# Patient Record
Sex: Male | Born: 1980 | Race: Black or African American | Hispanic: No | Marital: Married | Smoking: Never smoker
Health system: Southern US, Community
[De-identification: ages and names within clinical notes are randomized; demographics above are authoritative.]

---

## 2001-03-16 ENCOUNTER — Encounter: Payer: Self-pay | Admitting: Emergency Medicine

## 2001-03-16 ENCOUNTER — Emergency Department (HOSPITAL_COMMUNITY): Admission: EM | Admit: 2001-03-16 | Discharge: 2001-03-16 | Payer: Self-pay | Admitting: Emergency Medicine

## 2002-04-06 ENCOUNTER — Emergency Department (HOSPITAL_COMMUNITY): Admission: EM | Admit: 2002-04-06 | Discharge: 2002-04-07 | Payer: Self-pay | Admitting: Emergency Medicine

## 2008-08-06 ENCOUNTER — Encounter: Admission: RE | Admit: 2008-08-06 | Discharge: 2008-08-06 | Payer: Self-pay | Admitting: Pulmonary Disease

## 2010-01-01 ENCOUNTER — Encounter: Admission: RE | Admit: 2010-01-01 | Discharge: 2010-01-01 | Payer: Self-pay | Admitting: General Practice

## 2012-11-10 ENCOUNTER — Emergency Department (HOSPITAL_BASED_OUTPATIENT_CLINIC_OR_DEPARTMENT_OTHER)
Admission: EM | Admit: 2012-11-10 | Discharge: 2012-11-10 | Disposition: A | Discharge status: Home / Self Care | Payer: BC Managed Care – PPO | Attending: Emergency Medicine | Admitting: Emergency Medicine

## 2012-11-10 ENCOUNTER — Encounter (HOSPITAL_BASED_OUTPATIENT_CLINIC_OR_DEPARTMENT_OTHER): Payer: Self-pay | Admitting: *Deleted

## 2012-11-10 DIAGNOSIS — X503XXA Overexertion from repetitive movements, initial encounter: Secondary | ICD-10-CM | POA: Insufficient documentation

## 2012-11-10 DIAGNOSIS — Y9366 Activity, soccer: Secondary | ICD-10-CM | POA: Insufficient documentation

## 2012-11-10 DIAGNOSIS — S239XXA Sprain of unspecified parts of thorax, initial encounter: Secondary | ICD-10-CM | POA: Insufficient documentation

## 2012-11-10 DIAGNOSIS — S29012A Strain of muscle and tendon of back wall of thorax, initial encounter: Secondary | ICD-10-CM

## 2012-11-10 DIAGNOSIS — Y9239 Other specified sports and athletic area as the place of occurrence of the external cause: Secondary | ICD-10-CM | POA: Insufficient documentation

## 2012-11-10 MED ORDER — IBUPROFEN 800 MG PO TABS: 800.0000 mg | ORAL_TABLET | Freq: Three times a day (TID) | ORAL | Status: DC

## 2012-11-10 MED ORDER — METHOCARBAMOL 500 MG PO TABS: 500.0000 mg | ORAL_TABLET | Freq: Two times a day (BID) | ORAL | Status: DC

## 2012-11-10 NOTE — ED Notes (Signed)
Patient states he has had right lower back pain since this morning.  States yesterday he was playing soccer and may causes the pain.  Pain is radiating down into his buttocks and down right leg.

## 2012-11-10 NOTE — ED Provider Notes (Signed)
History     CSN: [id number]  Arrival date & time 11/10/12  1123   First MD Initiated Contact with Patient 11/10/12 1201      Chief Complaint  Patient presents with  . Back Pain    (Consider location/radiation/quality/duration/timing/severity/associated sxs/prior treatment) Patient is a 32 y.o. male presenting with back pain. The history is provided by the patient.  Back Pain Location:  Lumbar spine Quality:  Aching Radiates to:  Does not radiate Pain severity:  Moderate Onset quality:  Gradual Timing:  Constant Progression:  Worsening Chronicity:  New Context comment:  Exercise Relieved by:  Nothing Worsened by:  Nothing tried Ineffective treatments:  None tried Pt complains of pain in low back after playing soccer.  Pt has soreness down both legs  History reviewed. No pertinent past medical history.  History reviewed. No pertinent past surgical history.  No family history on file.  History  Substance Use Topics  . Smoking status: Never Smoker   . Smokeless tobacco: Never Used  . Alcohol Use: Yes     Comment: occassionally      Review of Systems  Musculoskeletal: Positive for back pain.  All other systems reviewed and are negative.    Allergies  Review of patient's allergies indicates no known allergies.  Home Medications  No current outpatient prescriptions on file.  BP 135/72  Pulse 67  Temp(Src) 98.3 F (36.8 C) (Oral)  Resp 18  Ht 5' 8.5" (1.74 m)  Wt 187 lb 3 oz (84.908 kg)  BMI 28.04 kg/m2  SpO2 99%  Physical Exam  Nursing note and vitals reviewed. Constitutional: He is oriented to person, place, and time. He appears well-developed and well-nourished.  HENT:  Head: Normocephalic and atraumatic.  Eyes: Pupils are equal, round, and reactive to light.  Cardiovascular: Normal rate.   Pulmonary/Chest: Effort normal.  Abdominal: Soft.  Musculoskeletal:  Tender ls spine,  From,  nv and ns intact,  Negative straight leg raise   Neurological: He is alert and oriented to person, place, and time. He has normal reflexes.  Skin: Skin is warm.  Psychiatric: He has a normal mood and affect.    ED Course  Procedures (including critical care time)  Labs Reviewed - No data to display No results found.   No diagnosis found.    MDM  Robaxin and ibuprofen        Lonia Skinner Romeo, PA-C 11/10/12 1225

## 2012-11-10 NOTE — ED Provider Notes (Signed)
Medical screening examination/treatment/procedure(s) were performed by non-physician practitioner and as supervising physician I was immediately available for consultation/collaboration.   Charles B. Sheldon, MD 11/10/12 1446 

## 2017-06-16 ENCOUNTER — Other Ambulatory Visit: Payer: Self-pay

## 2017-06-16 ENCOUNTER — Encounter (HOSPITAL_COMMUNITY): Payer: Self-pay | Admitting: *Deleted

## 2017-06-16 ENCOUNTER — Emergency Department (HOSPITAL_COMMUNITY): Payer: BLUE CROSS/BLUE SHIELD

## 2017-06-16 ENCOUNTER — Emergency Department (HOSPITAL_COMMUNITY)
Admission: EM | Admit: 2017-06-16 | Discharge: 2017-06-16 | Disposition: A | Discharge status: Home / Self Care | Payer: BLUE CROSS/BLUE SHIELD | Attending: Emergency Medicine | Admitting: Emergency Medicine

## 2017-06-16 DIAGNOSIS — R079 Chest pain, unspecified: Secondary | ICD-10-CM | POA: Diagnosis present

## 2017-06-16 DIAGNOSIS — R0789 Other chest pain: Secondary | ICD-10-CM | POA: Diagnosis not present

## 2017-06-16 DIAGNOSIS — Z79899 Other long term (current) drug therapy: Secondary | ICD-10-CM | POA: Diagnosis not present

## 2017-06-16 LAB — CBC
HCT: 40.9 % (ref 39.0–52.0)
Hemoglobin: 13.5 g/dL (ref 13.0–17.0)
MCH: 27.1 pg (ref 26.0–34.0)
MCHC: 33 g/dL (ref 30.0–36.0)
MCV: 82 fL (ref 78.0–100.0)
PLATELETS: 252 10*3/uL (ref 150–400)
RBC: 4.99 MIL/uL (ref 4.22–5.81)
RDW: 14 % (ref 11.5–15.5)
WBC: 6.9 10*3/uL (ref 4.0–10.5)

## 2017-06-16 LAB — BASIC METABOLIC PANEL
Anion gap: 9 (ref 5–15)
BUN: 8 mg/dL (ref 6–20)
CALCIUM: 9.5 mg/dL (ref 8.9–10.3)
CO2: 25 mmol/L (ref 22–32)
CREATININE: 1.06 mg/dL (ref 0.61–1.24)
Chloride: 104 mmol/L (ref 101–111)
GFR calc non Af Amer: >60 mL/min (ref >60)
Glucose, Bld: 94 mg/dL (ref 65–99)
Potassium: 4 mmol/L (ref 3.5–5.1)
Sodium: 138 mmol/L (ref 135–145)

## 2017-06-16 LAB — I-STAT TROPONIN, ED: TROPONIN I, POC: 0.01 ng/mL (ref 0.00–0.08)

## 2017-06-16 MED ORDER — CYCLOBENZAPRINE HCL 5 MG PO TABS: 5.0000 mg | ORAL_TABLET | Freq: Three times a day (TID) | ORAL | 0 refills | Status: AC | PRN

## 2017-06-16 MED ORDER — CYCLOBENZAPRINE HCL 10 MG PO TABS
10.0000 mg | ORAL_TABLET | Freq: Once | ORAL | Status: AC
  Administered 2017-06-16: 10 mg via ORAL
  Filled 2017-06-16: qty 1

## 2017-06-16 NOTE — ED Provider Notes (Signed)
[facility] Provider Note   CSN: [id number] Arrival date & time: 06/16/17  0844     History   Chief Complaint Chief Complaint  Patient presents with  . Chest Pain    HPI Edward Huerta is a 37 y.o. male with no PMH presenting with sharp back and chest pain that came on suddenly yesterday evening when he was resting on the couch. Patient reports that the pain was 10/10 severity and was worse when he raised up his arms, improved when he relaxed his arms to his side. Pain was not relieved with ibuprofen. He positioned himself to feel less pain throughout the night and came into the ED this AM. No nausea, vomiting, or diaphoresis. No dyspnea other than pain worse with deep breaths.  Pain has gradually improved throughout the morning. No trauma or unusual physical activity, occupation does not involve hard labor. He does not smoke or use illicit drugs, endorses occasional alcohol use. No recent URI symptoms, no cough. No N/V/D/C or urinary symptoms.  History reviewed. No pertinent past medical history.  There are no active problems to display for this patient.  History reviewed. No pertinent surgical history.   Home Medications    Prior to Admission medications   Medication Sig Start Date End Date Taking? Authorizing Provider  cyclobenzaprine (FLEXERIL) 5 MG tablet Take 1 tablet (5 mg total) by mouth 3 (three) times daily as needed for muscle spasms. 06/16/17   Howard PouchFeng, Kalif Kattner, MD  ibuprofen (ADVIL,MOTRIN) 800 MG tablet Take 1 tablet (800 mg total) by mouth 3 (three) times daily. 11/10/12   Elson AreasSofia, Leslie K, PA-C  methocarbamol (ROBAXIN) 500 MG tablet Take 1 tablet (500 mg total) by mouth 2 (two) times daily. 11/10/12   Elson AreasSofia, Leslie K, PA-C    Family History No family history on file.  Social History Social History   Tobacco Use  . Smoking status: Never Smoker  . Smokeless tobacco: Never Used  Substance Use Topics  . Alcohol use: Yes    Comment:  occassionally  . Drug use: No     Allergies   Patient has no known allergies.   Review of Systems Review of Systems See HPI for ROS   Physical Exam Updated Vital Signs BP (!) 154/90 (BP Location: Right Arm)   Pulse 60   Temp 98.9 F (37.2 C) (Oral)   Resp 16   Ht 5\' 8"  (1.727 m)   SpO2 99%   BMI 28.46 kg/m   Physical Exam  Constitutional: He is oriented to person, place, and time. He appears well-developed and well-nourished.  HENT:  Head: Normocephalic and atraumatic.  Neck: Neck supple.  Cardiovascular: Normal rate, regular rhythm and normal pulses.  No m/r/g  Pulmonary/Chest: Effort normal and breath sounds normal. No respiratory distress.  No W/R/R  Abdominal: Soft. He exhibits no distension. There is no splenomegaly or hepatomegaly.  Musculoskeletal:       Right lower leg: He exhibits no edema.       Left lower leg: He exhibits no edema.  +ttp over trapezius medial to right scapula, no spinal process ttp.  Neurological: He is oriented to person, place, and time.  Skin: Skin is warm and dry.  Psychiatric: He has a normal mood and affect. His behavior is normal.     ED Treatments / Results  Labs (all labs ordered are listed, but only abnormal results are displayed) Labs Reviewed  BASIC METABOLIC PANEL  CBC  I-STAT TROPONIN, ED  EKG  EKG Interpretation  Date/Time:  Thursday June 16 2017 08:49:10 EST Ventricular Rate:  64 PR Interval:  140 QRS Duration: 88 QT Interval:  354 QTC Calculation: 365 R Axis:   51 Text Interpretation:  Normal sinus rhythm Normal ECG Confirmed by Cathren Laine ([id number]) on 06/16/2017 10:31:46 AM       Radiology Dg Chest 2 View  Result Date: 06/16/2017 CLINICAL DATA:  Chest pain.  Shortness of breath. EXAM: CHEST  2 VIEW COMPARISON:  08/06/2008. FINDINGS: Mediastinum and hilar structures are normal. Mild bibasilar subsegmental atelectasis. No focal alveolar infiltrate. No pleural effusion or pneumothorax. Heart size  normal. IMPRESSION: Mild bibasilar subsegmental atelectasis.  No focal infiltrate. Electronically Signed   By: Maisie Fus  Register   On: 06/16/2017 10:10    Procedures Procedures (including critical care time)  Medications Ordered in ED Medications  cyclobenzaprine (FLEXERIL) tablet 10 mg (10 mg Oral Given 06/16/17 1105)     Initial Impression / Assessment and Plan / ED Course  I have reviewed the triage vital signs and the nursing notes.  Pertinent labs & imaging results that were available during my care of the patient were reviewed by me and considered in my medical decision making (see chart for details).    37 year old with no PMH presenting for chest and back pain over the last 12 hours that came on suddenly at rest, most consistent with MSK pain.  MI less likely with duration and pattern of pain, as well as normal EKG and troponin. Considered PE, though this seems less likely with no tachycardia or hypoxia, patient is not feeling dyspneic and pain is reproducible on exam.  Dissection also considered, however patient is appearing comfortable now, CXR WNL, vitals normal.  Gave flexeril for MSK pain with some improvement in symptoms.  Patient continues to appear well in the emergency department. He is considered stable for discharge. Return precautions provided.  Final Clinical Impressions(s) / ED Diagnoses   Final diagnoses:  Chest wall pain    ED Discharge Orders        Ordered    cyclobenzaprine (FLEXERIL) 5 MG tablet  3 times daily PRN     06/16/17 1114     Howard Pouch, MD 06/16/17 1118    Howard Pouch, MD 06/16/17 1124    Cathren Laine, MD 06/16/17 1342

## 2017-06-16 NOTE — ED Triage Notes (Signed)
C/o chest pain worse with movement denies injury

## 2017-06-16 NOTE — Discharge Instructions (Signed)
You were seen in the emergency department for chest and back pain. This is most likely due to muscular pain. Your heart tests were normal.  You were sent with a muscle relaxer which may help with your pain. This may make you sleepy so please do not drive after taking the medication.  Please follow up with your regular doctor. Reasons to return to care would be if you have chest pain that is not improving, or if you have difficulty breathing.

## 2017-06-16 NOTE — ED Notes (Signed)
Pt getting dressed and awaiting nurse,.

## 2017-06-18 ENCOUNTER — Other Ambulatory Visit: Payer: Self-pay

## 2017-06-18 ENCOUNTER — Emergency Department (HOSPITAL_COMMUNITY)
Admission: EM | Admit: 2017-06-18 | Discharge: 2017-06-18 | Disposition: A | Discharge status: Home / Self Care | Payer: BLUE CROSS/BLUE SHIELD | Attending: Emergency Medicine | Admitting: Emergency Medicine

## 2017-06-18 ENCOUNTER — Encounter (HOSPITAL_COMMUNITY): Payer: Self-pay | Admitting: Emergency Medicine

## 2017-06-18 ENCOUNTER — Emergency Department (HOSPITAL_COMMUNITY): Payer: BLUE CROSS/BLUE SHIELD

## 2017-06-18 DIAGNOSIS — R0602 Shortness of breath: Secondary | ICD-10-CM | POA: Diagnosis not present

## 2017-06-18 DIAGNOSIS — R0789 Other chest pain: Secondary | ICD-10-CM | POA: Insufficient documentation

## 2017-06-18 DIAGNOSIS — R072 Precordial pain: Secondary | ICD-10-CM | POA: Diagnosis present

## 2017-06-18 LAB — BASIC METABOLIC PANEL
Anion gap: 12 (ref 5–15)
BUN: 8 mg/dL (ref 6–20)
CHLORIDE: 104 mmol/L (ref 101–111)
CO2: 21 mmol/L — AB (ref 22–32)
CREATININE: 1.16 mg/dL (ref 0.61–1.24)
Calcium: 9.2 mg/dL (ref 8.9–10.3)
GFR calc non Af Amer: >60 mL/min (ref >60)
Glucose, Bld: 114 mg/dL — ABNORMAL HIGH (ref 65–99)
POTASSIUM: 3.6 mmol/L (ref 3.5–5.1)
SODIUM: 137 mmol/L (ref 135–145)

## 2017-06-18 LAB — CBC
HEMATOCRIT: 40 % (ref 39.0–52.0)
HEMOGLOBIN: 12.9 g/dL — AB (ref 13.0–17.0)
MCH: 26.5 pg (ref 26.0–34.0)
MCHC: 32.3 g/dL (ref 30.0–36.0)
MCV: 82.1 fL (ref 78.0–100.0)
Platelets: 221 10*3/uL (ref 150–400)
RBC: 4.87 MIL/uL (ref 4.22–5.81)
RDW: 14 % (ref 11.5–15.5)
WBC: 7.1 10*3/uL (ref 4.0–10.5)

## 2017-06-18 LAB — I-STAT TROPONIN, ED
Troponin i, poc: 0 ng/mL (ref 0.00–0.08)
Troponin i, poc: 0 ng/mL (ref 0.00–0.08)

## 2017-06-18 LAB — INFLUENZA PANEL BY PCR (TYPE A & B)
INFLAPCR: NEGATIVE
INFLBPCR: NEGATIVE

## 2017-06-18 MED ORDER — ACETAMINOPHEN 500 MG PO TABS
1000.0000 mg | ORAL_TABLET | Freq: Once | ORAL | Status: AC
Start: 2017-06-18 — End: 2017-06-18
  Administered 2017-06-18: 1000 mg via ORAL
  Filled 2017-06-18: qty 2

## 2017-06-18 MED ORDER — DEXAMETHASONE SODIUM PHOSPHATE 10 MG/ML IJ SOLN
10.0000 mg | Freq: Once | INTRAMUSCULAR | Status: AC
  Administered 2017-06-18: 10 mg via INTRAVENOUS
  Filled 2017-06-18: qty 1

## 2017-06-18 MED ORDER — IPRATROPIUM-ALBUTEROL 0.5-2.5 (3) MG/3ML IN SOLN
3.0000 mL | Freq: Once | RESPIRATORY_TRACT | Status: AC
  Administered 2017-06-18: 3 mL via RESPIRATORY_TRACT
  Filled 2017-06-18: qty 3

## 2017-06-18 MED ORDER — ALBUTEROL SULFATE HFA 108 (90 BASE) MCG/ACT IN AERS: 1.0000 | INHALATION_SPRAY | Freq: Four times a day (QID) | RESPIRATORY_TRACT | 0 refills | Status: DC | PRN

## 2017-06-18 MED ORDER — MELOXICAM 15 MG PO TABS: 15.0000 mg | ORAL_TABLET | Freq: Every day | ORAL | 0 refills | Status: DC

## 2017-06-18 MED ORDER — IPRATROPIUM-ALBUTEROL 0.5-2.5 (3) MG/3ML IN SOLN
RESPIRATORY_TRACT | Status: AC
  Filled 2017-06-18: qty 3

## 2017-06-18 MED ORDER — IOPAMIDOL (ISOVUE-370) INJECTION 76%
INTRAVENOUS | Status: AC
  Administered 2017-06-18: 80 mL
  Filled 2017-06-18: qty 100

## 2017-06-18 MED ORDER — ONDANSETRON HCL 4 MG/2ML IJ SOLN
4.0000 mg | Freq: Once | INTRAMUSCULAR | Status: AC
  Administered 2017-06-18: 4 mg via INTRAVENOUS
  Filled 2017-06-18: qty 2

## 2017-06-18 MED ORDER — MORPHINE SULFATE (PF) 4 MG/ML IV SOLN
4.0000 mg | Freq: Once | INTRAVENOUS | Status: AC
  Administered 2017-06-18: 4 mg via INTRAVENOUS
  Filled 2017-06-18: qty 1

## 2017-06-18 MED ORDER — KETOROLAC TROMETHAMINE 30 MG/ML IJ SOLN
15.0000 mg | Freq: Once | INTRAMUSCULAR | Status: AC
  Administered 2017-06-18: 15 mg via INTRAVENOUS
  Filled 2017-06-18: qty 1

## 2017-06-18 NOTE — ED Notes (Signed)
Pt's temp was 100.5 RN Jesse SansJanee was notified

## 2017-06-18 NOTE — Discharge Instructions (Signed)
Your workup and imaging has been reassuring.  No signs of a heart attack or blood clot.  Your symptoms seem consistent with inflammation of your lung lining.  Please take the mobic as prescribed for pain. Do not take any additional NSAIDs including Motrin, Aleve, Ibuprofen, Advil.  May also take Tylenol.  Have given you an albuterol inhaler to use for any shortness of breath or cough.  Please make sure that you follow-up with your primary care doctor.  If your symptoms persist and are not improving the next 2-3 days return to the ED or follow with primary care doctor to have further workup.

## 2017-06-18 NOTE — ED Triage Notes (Signed)
Pt states he was seen in ED Thursday for intermittent chest pain that is now constant.  Reports sharp pain in center of chest that is non-radiating.  Intermittent SOB.

## 2017-06-18 NOTE — ED Provider Notes (Signed)
[facility] EMERGENCY DEPARTMENT Provider Note   CSN: [id number] Arrival date & time: 06/18/17  0306     History   Chief Complaint Chief Complaint  Patient presents with  . Chest Pain    HPI Edward Huerta is a 37 y.o. male.  HPI 37 year old African-American male with no pertinent past medical history presents to the emergency department today for evaluation of ongoing substernal chest pain.  Patient was seen on 1/24 in the ED for same.  At that time he had negative chest x-ray.  Was diagnosed with musculoskeletal pain and given a muscle relaxer.  Patient states that this is not helping his pain.  Patient reports substernal chest pain that is sharp and nonradiating.  Reports pleuritic chest pain and associated shortness of breath.  Patient states that it is hard to take a deep breath due to the pain.  Patient states the pain is constant and nothing makes better.  He has not training for the pain prior to arrival.  Patient denies any associated diaphoresis, nausea or emesis.  Denies any associated cough, congestion, sore throat.  Patient was febrile in the ED.  Denies any recent travel, history of IV drug use, long immobilization or recent tunnelization/surgeries, unilateral leg swelling or calf tenderness, tobacco use, history of DVT/PE.  Has any cardiac history or significant family cardiac history.  Pt denies any chill, ha, vision changes, lightheadedness, dizziness, congestion, neck pain, cough, abd pain, n/v/d, urinary symptoms, change in bowel habits, melena, hematochezia, lower extremity paresthesias.  History reviewed. No pertinent past medical history.  There are no active problems to display for this patient.   History reviewed. No pertinent surgical history.     Home Medications    Prior to Admission medications   Medication Sig Start Date End Date Taking? Authorizing Provider  cyclobenzaprine (FLEXERIL) 5 MG tablet Take 1 tablet (5 mg total) by mouth 3  (three) times daily as needed for muscle spasms. 06/16/17  Yes Howard Pouch, MD  ibuprofen (ADVIL,MOTRIN) 800 MG tablet Take 1 tablet (800 mg total) by mouth 3 (three) times daily. Patient not taking: Reported on 06/18/2017 11/10/12   Elson Areas, PA-C  methocarbamol (ROBAXIN) 500 MG tablet Take 1 tablet (500 mg total) by mouth 2 (two) times daily. Patient not taking: Reported on 06/18/2017 11/10/12   Osie Cheeks    Family History No family history on file.  Social History Social History   Tobacco Use  . Smoking status: Never Smoker  . Smokeless tobacco: Never Used  Substance Use Topics  . Alcohol use: Yes    Comment: occassionally  . Drug use: No     Allergies   Patient has no known allergies.   Review of Systems Review of Systems  Constitutional: Positive for fever. Negative for chills and diaphoresis.  HENT: Negative for congestion and sore throat.   Eyes: Negative for visual disturbance.  Respiratory: Positive for shortness of breath. Negative for cough, chest tightness and wheezing.   Cardiovascular: Positive for chest pain. Negative for palpitations and leg swelling.  Gastrointestinal: Negative for abdominal pain, diarrhea, nausea and vomiting.  Genitourinary: Negative for dysuria, flank pain, frequency, hematuria, scrotal swelling, testicular pain and urgency.  Musculoskeletal: Negative for arthralgias and myalgias.  Skin: Negative for rash.  Neurological: Negative for dizziness, syncope, weakness, light-headedness, numbness and headaches.  Psychiatric/Behavioral: Negative for sleep disturbance. The patient is not nervous/anxious.      Physical Exam Updated Vital Signs BP (!) 144/80  Pulse 78   Temp (!) 100.6 F (38.1 C) (Oral)   Resp 18   SpO2 97%   Physical Exam  Constitutional: He is oriented to person, place, and time. He appears well-developed and well-nourished.  Non-toxic appearance. No distress.  HENT:  Head: Normocephalic and  atraumatic.  Nose: Nose normal.  Mouth/Throat: Oropharynx is clear and moist.  Eyes: Conjunctivae are normal. Pupils are equal, round, and reactive to light. Right eye exhibits no discharge. Left eye exhibits no discharge.  Neck: Normal range of motion. Neck supple. No JVD present. No tracheal deviation present.  No c spine midline tenderness. No paraspinal tenderness. No deformities or step offs noted. Full ROM. Supple. No nuchal rigidity.    Cardiovascular: Normal rate, regular rhythm, normal heart sounds and intact distal pulses.  Pulmonary/Chest: Effort normal. No respiratory distress. He has decreased breath sounds. He has no wheezes. He has no rhonchi. He has no rales. He exhibits no tenderness.  Patient speaking in short complete sentences due to the pain.  No hypoxia or tachypnea.  Abdominal: Soft. Bowel sounds are normal. He exhibits no distension. There is no tenderness. There is no rebound and no guarding.  Musculoskeletal: Normal range of motion.       Right lower leg: Normal.       Left lower leg: Normal.  No lower extremity edema or calf tenderness.  Lymphadenopathy:    He has no cervical adenopathy.  Neurological: He is alert and oriented to person, place, and time.  Skin: Skin is warm and dry. Capillary refill takes less than 2 seconds. He is not diaphoretic.  Psychiatric: His behavior is normal. Judgment and thought content normal.  Nursing note and vitals reviewed.    ED Treatments / Results  Labs (all labs ordered are listed, but only abnormal results are displayed) Labs Reviewed  BASIC METABOLIC PANEL - Abnormal; Notable for the following components:      Result Value   CO2 21 (*)    Glucose, Bld 114 (*)    All other components within normal limits  CBC - Abnormal; Notable for the following components:   Hemoglobin 12.9 (*)    All other components within normal limits  INFLUENZA PANEL BY PCR (TYPE A & B)  I-STAT TROPONIN, ED    EKG  EKG  Interpretation  Date/Time:  Saturday June 18 2017 03:13:05 EST Ventricular Rate:  86 PR Interval:  136 QRS Duration: 86 QT Interval:  314 QTC Calculation: 375 R Axis:   33 Text Interpretation:  Normal sinus rhythm Normal ECG Normal ECG Confirmed by Gerhard MunchLockwood, Robert [phone]([id number]) on 06/18/2017 7:58:12 AM       Radiology Dg Chest 2 View  Result Date: 06/18/2017 CLINICAL DATA:  Patient was seen in the ED Thursday for intermittent chest pain. Chest pain is now constant. Sharp central non radiating pain. Intermittent shortness of breath. EXAM: CHEST  2 VIEW COMPARISON:  06/16/2017 FINDINGS: Slightly shallow inspiration. The heart size and mediastinal contours are within normal limits. Both lungs are clear. The visualized skeletal structures are unremarkable. IMPRESSION: No active cardiopulmonary disease. Electronically Signed   By: Burman NievesWilliam  Stevens M.D.   On: 06/18/2017 04:20   Ct Angio Chest Pe W/cm &/or Wo Cm  Result Date: 06/18/2017 CLINICAL DATA:  Chest pain and shortness of breath since 06/16/2017. EXAM: CT ANGIOGRAPHY CHEST WITH CONTRAST TECHNIQUE: Multidetector CT imaging of the chest was performed using the standard protocol during bolus administration of intravenous contrast. Multiplanar CT image reconstructions and MIPs were  obtained to evaluate the vascular anatomy. CONTRAST:  80 ml ISOVUE-370 IOPAMIDOL (ISOVUE-370) INJECTION 76% COMPARISON:  PA and lateral chest earlier today. Single-view of the chest 08/06/2008. FINDINGS: Cardiovascular: No pulmonary embolus is identified. Heart size is upper normal. No pericardial effusion. No calcific atherosclerosis is identified. Mediastinum/Nodes: No enlarged mediastinal, hilar, or axillary lymph nodes. Thyroid gland, trachea, and esophagus demonstrate no significant findings. Lungs/Pleura: No pleural effusion. There is some dependent atelectasis in the lung bases bilaterally. The lungs are otherwise clear. Upper Abdomen: Negative. Musculoskeletal: No  focal bony abnormality. Review of the MIP images confirms the above findings. IMPRESSION: Negative for pulmonary embolus or acute disease. Electronically Signed   By: Drusilla Kanner M.D.   On: 06/18/2017 09:18    Procedures Procedures (including critical care time)  Medications Ordered in ED Medications  ipratropium-albuterol (DUONEB) 0.5-2.5 (3) MG/3ML nebulizer solution (  Not Given 06/18/17 0831)  acetaminophen (TYLENOL) tablet 1,000 mg (1,000 mg Oral Given 06/18/17 0758)  ipratropium-albuterol (DUONEB) 0.5-2.5 (3) MG/3ML nebulizer solution 3 mL (3 mLs Nebulization Given 06/18/17 0809)  ketorolac (TORADOL) 30 MG/ML injection 15 mg (15 mg Intravenous Given 06/18/17 0809)  morphine 4 MG/ML injection 4 mg (4 mg Intravenous Given 06/18/17 0809)  ondansetron (ZOFRAN) injection 4 mg (4 mg Intravenous Given 06/18/17 0810)  ipratropium-albuterol (DUONEB) 0.5-2.5 (3) MG/3ML nebulizer solution 3 mL (3 mLs Nebulization Given 06/18/17 0826)  iopamidol (ISOVUE-370) 76 % injection (80 mLs  Contrast Given 06/18/17 0855)  dexamethasone (DECADRON) injection 10 mg (10 mg Intravenous Given 06/18/17 0946)     Initial Impression / Assessment and Plan / ED Course  I have reviewed the triage vital signs and the nursing notes.  Pertinent labs & imaging results that were available during my care of the patient were reviewed by me and considered in my medical decision making (see chart for details).     Patient presents to the ED for evaluation of pleuritic chest pain that is substernal and nonradiating.  Reports associated shortness of breath but denies any nausea, vomiting, diaphoresis.  Patient recently seen in the ED for same and diagnosed with musculoskeletal pain that was not improving with muscle relaxers.  Patient also reports developing a fever.  Denies any other associated URI symptoms or infectious symptoms including urinary symptoms, diarrhea or abdominal pain. PERC negative.   Patient does appear  uncomfortable on exam due to substernal chest pain.  He also is tachypneic and taking shallow breaths due to the pain.  Patient is febrile in the ED.  He is not hypoxic, no tachypnea or tachycardia is noted.   Lungs are clear to auscultation bilaterally.  Abdominal exam is benign.  Heart regular rate and rhythm without any rubs murmurs or gallops noted.  Neurovascularly intact in all extremities.  No significant signs of dehydration.  No nuchal rigidity that be concerning for meningitis.  Patient's lab work is reassuring.  No leukocytosis.  Hemoglobin appears at baseline.  Electrolytes are unremarkable.  Creatinine was normal.  Flu test was negative.  Patient had 2- delta troponins.  Patient is PERC negative however given his pleuritic chest pain was second ED visit for same felt it was reasonable to obtain a CT scan of chest to rule out any PE or occult pneumonia.  CT scan returned was unremarkable for any acute findings specifically no signs of PE or occult pneumonia.  I do not appreciate any significant pericardial effusion.  NSAIDs, steroids, albuterol inhaler and antipyretics in the ED.  On repeat assessment he states  that he feels much improved and his chest pain is completely resolved.  Patient appears more comfortable on exam.  Patient's presentation likely consistent with pleurisy.  Patient EKG showed normal sinus rhythm.  Negative delta troponins.  Very atypical for ACS.  No signs of PE or pneumonia on ct scan.  Patient denies any IV drug use.  Doubt bacteremia.  No signs of meningitis.  This is likely a viral illness causing the patient's pleurisy.  Encourage patient to treat with NSAIDs at home we will give prescription for Mobic.  Given albuterol inhaler to use as needed.  I instructed patient that if symptoms persist he follow-up with his primary doctor for further workup including possible echo.  Given normal EKG and troponins low suspicion for pericarditis or myocarditis at this  time.  Pt is hemodynamically stable, in NAD, & able to ambulate in the ED. Evaluation does not show pathology that would require ongoing emergent intervention or inpatient treatment. I explained the diagnosis to the patient. Pain has been managed & has no complaints prior to dc. Pt is comfortable with above plan and is stable for discharge at this time. All questions were answered prior to disposition. Strict return precautions for f/u to the ED were discussed. Encouraged follow up with PCP.  Pt seen and eval by my attending who is agreeable with the above plan.   Final Clinical Impressions(s) / ED Diagnoses   Final diagnoses:  Atypical chest pain  SOB (shortness of breath)    ED Discharge Orders        Ordered    meloxicam (MOBIC) 15 MG tablet  Daily     06/18/17 1047    albuterol (PROVENTIL HFA;VENTOLIN HFA) 108 (90 Base) MCG/ACT inhaler  Every 6 hours PRN     06/18/17 1047       Rise Mu, PA-C 06/18/17 1055    Gerhard Munch, MD 06/19/17 1650

## 2017-06-21 ENCOUNTER — Encounter (HOSPITAL_COMMUNITY): Payer: Self-pay

## 2017-06-21 ENCOUNTER — Emergency Department (HOSPITAL_COMMUNITY)
Admission: EM | Admit: 2017-06-21 | Discharge: 2017-06-21 | Disposition: A | Discharge status: Home / Self Care | Payer: BLUE CROSS/BLUE SHIELD | Attending: Emergency Medicine | Admitting: Emergency Medicine

## 2017-06-21 ENCOUNTER — Emergency Department (HOSPITAL_COMMUNITY): Payer: BLUE CROSS/BLUE SHIELD

## 2017-06-21 ENCOUNTER — Other Ambulatory Visit: Payer: Self-pay

## 2017-06-21 DIAGNOSIS — R0789 Other chest pain: Secondary | ICD-10-CM | POA: Diagnosis not present

## 2017-06-21 DIAGNOSIS — R079 Chest pain, unspecified: Secondary | ICD-10-CM | POA: Diagnosis present

## 2017-06-21 LAB — BASIC METABOLIC PANEL
ANION GAP: 7 (ref 5–15)
BUN: 12 mg/dL (ref 6–20)
CO2: 28 mmol/L (ref 22–32)
Calcium: 9.3 mg/dL (ref 8.9–10.3)
Chloride: 102 mmol/L (ref 101–111)
Creatinine, Ser: 0.98 mg/dL (ref 0.61–1.24)
GFR calc non Af Amer: >60 mL/min (ref >60)
Glucose, Bld: 89 mg/dL (ref 65–99)
POTASSIUM: 3.8 mmol/L (ref 3.5–5.1)
SODIUM: 137 mmol/L (ref 135–145)

## 2017-06-21 LAB — I-STAT TROPONIN, ED: Troponin i, poc: 0 ng/mL (ref 0.00–0.08)

## 2017-06-21 LAB — CBC
HCT: 39 % (ref 39.0–52.0)
HEMOGLOBIN: 12.7 g/dL — AB (ref 13.0–17.0)
MCH: 26.3 pg (ref 26.0–34.0)
MCHC: 32.6 g/dL (ref 30.0–36.0)
MCV: 80.7 fL (ref 78.0–100.0)
Platelets: 259 10*3/uL (ref 150–400)
RBC: 4.83 MIL/uL (ref 4.22–5.81)
RDW: 13.8 % (ref 11.5–15.5)
WBC: 5.8 10*3/uL (ref 4.0–10.5)

## 2017-06-21 MED ORDER — OMEPRAZOLE 20 MG PO CPDR: 20.0000 mg | DELAYED_RELEASE_CAPSULE | Freq: Every day | ORAL | 0 refills | Status: AC

## 2017-06-21 MED ORDER — PANTOPRAZOLE SODIUM 20 MG PO TBEC
20.0000 mg | DELAYED_RELEASE_TABLET | Freq: Once | ORAL | Status: AC
  Administered 2017-06-21: 20 mg via ORAL
  Filled 2017-06-21: qty 1

## 2017-06-21 MED ORDER — OMEPRAZOLE 20 MG PO CPDR: 20.0000 mg | DELAYED_RELEASE_CAPSULE | Freq: Every day | ORAL | 0 refills | Status: DC

## 2017-06-21 MED ORDER — GI COCKTAIL ~~LOC~~
30.0000 mL | Freq: Once | ORAL | Status: AC
  Administered 2017-06-21: 30 mL via ORAL
  Filled 2017-06-21: qty 30

## 2017-06-21 NOTE — Discharge Instructions (Signed)
It is important that you make a follow-up appointment with a family doctor.  If your symptoms persist, you may need additional testing scheduled or referral to specialist.  At this time, follow instructions for gastroesophageal reflux disease.  Take Prilosec every morning for the next 2 weeks.

## 2017-06-21 NOTE — ED Provider Notes (Signed)
[facility] Provider Note   CSN: [id number] Arrival date & time: 06/21/17  7829     History   Chief Complaint Chief Complaint  Patient presents with  . Chest Pain    HPI Edward Huerta is a 37 y.o. male.  HPI Patient has a same persisting pain that he has had since onset 6 days ago.  This is a third ED visit for this pain.  He reports that it is worse at certain times as opposed to others.  Pain is central below the sternum.  Right now he does not have much pain.  He reports during the night though he awakened with severe pain.  It did improve somewhat with sitting up.  He reports he has finished anti-inflammatories were prescribed to him.  He has not developed any new or additional symptoms.  No fever, no cough, no chills, no lower extremity swelling. Patient has lived in the [place] for 17 years.  Reports last time he traveled was in 2013.  He works at the same job that he has for the past 17 years.  No changes.  He does not notice any changes with eating. History reviewed. No pertinent past medical history.  There are no active problems to display for this patient.   History reviewed. No pertinent surgical history.     Home Medications    Prior to Admission medications   Medication Sig Start Date End Date Taking? Authorizing Provider  acetaminophen (TYLENOL) 500 MG tablet Take 500 mg by mouth 3 (three) times daily as needed (PAIN).   Yes [provider]  cyclobenzaprine (FLEXERIL) 5 MG tablet Take 1 tablet (5 mg total) by mouth 3 (three) times daily as needed for muscle spasms. 06/16/17  Yes Howard Pouch, MD  meloxicam (MOBIC) 15 MG tablet Take 1 tablet (15 mg total) by mouth daily. 06/18/17  Yes Leaphart, Lynann Beaver, PA-C  albuterol (PROVENTIL HFA;VENTOLIN HFA) 108 (90 Base) MCG/ACT inhaler Inhale 1-2 puffs into the lungs every 6 (six) hours as needed for wheezing or shortness of breath. Patient not taking: Reported on 06/21/2017  06/18/17   Demetrios Loll T, PA-C  ibuprofen (ADVIL,MOTRIN) 800 MG tablet Take 1 tablet (800 mg total) by mouth 3 (three) times daily. Patient not taking: Reported on 06/18/2017 11/10/12   Elson Areas, PA-C  methocarbamol (ROBAXIN) 500 MG tablet Take 1 tablet (500 mg total) by mouth 2 (two) times daily. Patient not taking: Reported on 06/18/2017 11/10/12   Elson Areas, PA-C  omeprazole (PRILOSEC) 20 MG capsule Take 1 capsule (20 mg total) by mouth daily. 06/21/17   Arby Barrette, MD    Family History History reviewed. No pertinent family history.  Social History Social History   Tobacco Use  . Smoking status: Never Smoker  . Smokeless tobacco: Never Used  Substance Use Topics  . Alcohol use: Yes    Comment: occassionally  . Drug use: No     Allergies   Patient has no known allergies.   Review of Systems Review of Systems 10 Systems reviewed and are negative for acute change except as noted in the HPI.   Physical Exam Updated Vital Signs BP 140/90   Pulse 68   Temp 98.7 F (37.1 C) (Oral)   Resp 16   Ht 5\' 8"  (1.727 m)   Wt 79.8 kg (176 lb)   SpO2 100%   BMI 26.76 kg/m   Physical Exam  Constitutional: He is oriented to person, place, and  time. He appears well-developed and well-nourished.  HENT:  Head: Normocephalic and atraumatic.  Posterior oropharynx widely patent.  No erythema no exudate.  Dentition in good condition.  Eyes: Conjunctivae and EOM are normal.  Neck: Neck supple.  Cardiovascular: Normal rate, regular rhythm, normal heart sounds and intact distal pulses.  No murmur heard. Pulmonary/Chest: Effort normal and breath sounds normal. No respiratory distress.  He is not endorsing reproducible chest pain at this time.  Abdominal: Soft. He exhibits no distension. There is no tenderness. There is no guarding.  Musculoskeletal: Normal range of motion. He exhibits no edema or tenderness.  Extremities are normal.  Well-developed well conditioned.   No peripheral edema or calf tenderness.  Lymphadenopathy:    He has no cervical adenopathy.  Neurological: He is alert and oriented to person, place, and time. No cranial nerve deficit. He exhibits normal muscle tone. Coordination normal.  Skin: Skin is warm and dry.  Psychiatric: He has a normal mood and affect.  Nursing note and vitals reviewed.    ED Treatments / Results  Labs (all labs ordered are listed, but only abnormal results are displayed) Labs Reviewed  CBC - Abnormal; Notable for the following components:      Result Value   Hemoglobin 12.7 (*)    All other components within normal limits  BASIC METABOLIC PANEL  I-STAT TROPONIN, ED    EKG  EKG Interpretation  Date/Time:  Tuesday June 21 2017 09:06:20 EST Ventricular Rate:  71 PR Interval:    QRS Duration: 88 QT Interval:  346 QTC Calculation: 376 R Axis:   51 Text Interpretation:  Sinus rhythm normal, no change. Confirmed by Arby Barrette [phone]) on 06/21/2017 12:11:28 PM       Radiology Dg Chest 2 View  Result Date: 06/21/2017 CLINICAL DATA:  Chest pain fever EXAM: CHEST  2 VIEW COMPARISON:  Chest CT 06/18/2017 FINDINGS: Normal cardiac silhouette. Mild streaky densities in the RIGHT lung base corresponding mild airspace disease on comparison CT. Trace pleural effusions posteriorly. IMPRESSION: Concern for mild RIGHT lower lobe pneumonia versus atelectasis. Electronically Signed   By: Genevive Bi M.D.   On: 06/21/2017 09:44    Procedures Procedures (including critical care time)  Medications Ordered in ED Medications  pantoprazole (PROTONIX) EC tablet 20 mg (not administered)  gi cocktail (Maalox,Lidocaine,Donnatal) (30 mLs Oral Given 06/21/17 1240)     Initial Impression / Assessment and Plan / ED Course  I have reviewed the triage vital signs and the nursing notes.  Pertinent labs & imaging results that were available during my care of the patient were reviewed by me and considered in my  medical decision making (see chart for details).      Final Clinical Impressions(s) / ED Diagnoses   Final diagnoses:  Atypical chest pain   Patient had extensive diagnostic evaluation in the past several days.  CT chest did not identify PE or other structural anomalies.  Patient has not developed fever, cough or dyspnea.  Lungs are clear and cardiac auscultation is normal.  At this time I do not have reason to believe he has endocarditis or valvular disease.  Patient has lived in the [place] for over a decade, have low suspicion for sequelae of tropical illness with lack of other positive findings on physical examination such is suggestion of congestive heart failure,  Murmur, fever or any vital sign anomaly.  Clinically, the patient is well in appearance with repeat negative diagnostic workup.  He has been treated for  suspected musculoskeletal pain.  Other consideration is for reflux or esophageal spasm.  He describes pain as severe when it occurs.  Will trial Prilosec and have counseled the patient on necessity of scheduling outpatient follow-up for potential referral to specialist or other diagnostic evaluation if symptoms are not resolving. ED Discharge Orders        Ordered    omeprazole (PRILOSEC) 20 MG capsule  Daily     06/21/17 1240       Arby BarrettePfeiffer, Francina Beery, MD 06/21/17 1247

## 2017-06-21 NOTE — ED Triage Notes (Signed)
Patient states he was [facility] for [facility] and was prescribed medications. Patient states it did not help the pain. Patient states he has heaviness in the mid chest and states when he lies flat the pain is worse. Patient denies any SOB or diaphoresis.

## 2017-06-23 LAB — CULTURE, BLOOD (SINGLE)
CULTURE: NO GROWTH
SPECIAL REQUESTS: ADEQUATE

## 2018-08-02 IMAGING — CT CT ANGIO CHEST
2 of 6 series · 18 of 36 positions shown · IV contrast (Omni 300)
Comparison: PA and lateral chest earlier today. Single-view of the
chest 08/06/2008.

CLINICAL DATA: Chest pain and shortness of breath since 06/16/2017.

EXAM:
CT ANGIOGRAPHY CHEST WITH CONTRAST
TECHNIQUE: Multidetector CT imaging of the chest was performed using the
standard protocol during bolus administration of intravenous
contrast. Multiplanar CT image reconstructions and MIPs were
obtained to evaluate the vascular anatomy.
CONTRAST:  80 ml 4Q6LHY-8X6 IOPAMIDOL ([id number]) INJECTION 76%

[Series 7: pe thins · axial · 0.62mm/px · z∈[+1196,+1445]mm · 17 of 281 slices shown]
[im 16/281  lung]
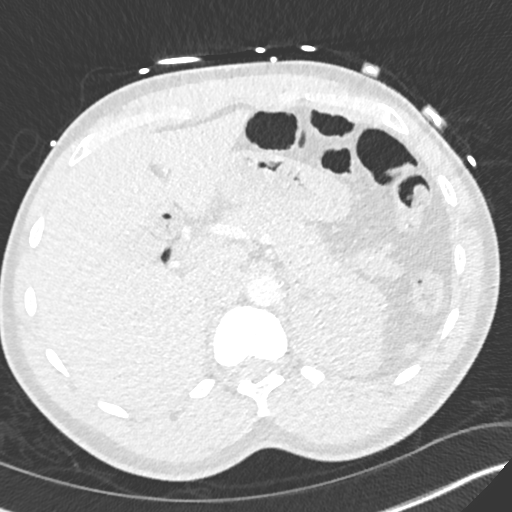
[im 32/281  mediastinal]
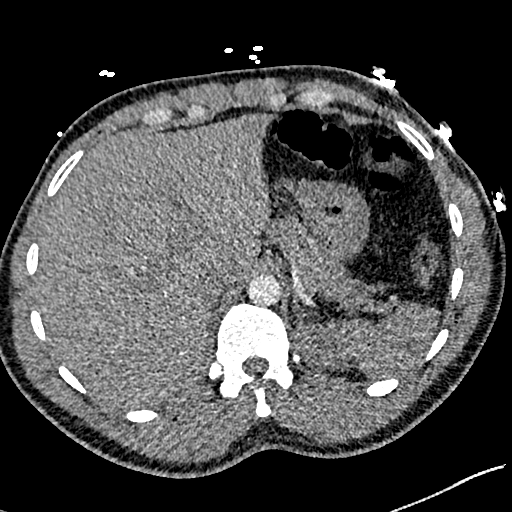
[im 47/281  lung]
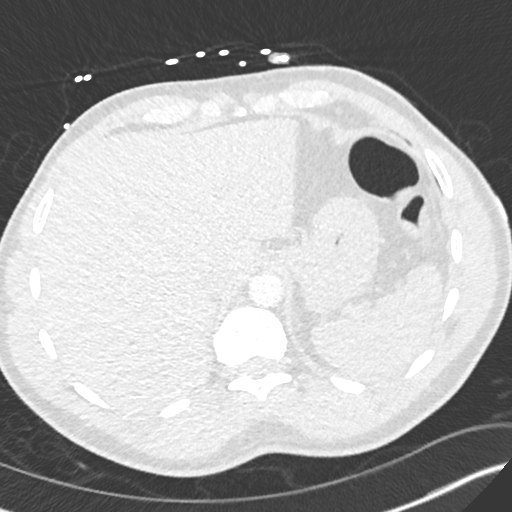
[im 63/281  mediastinal]
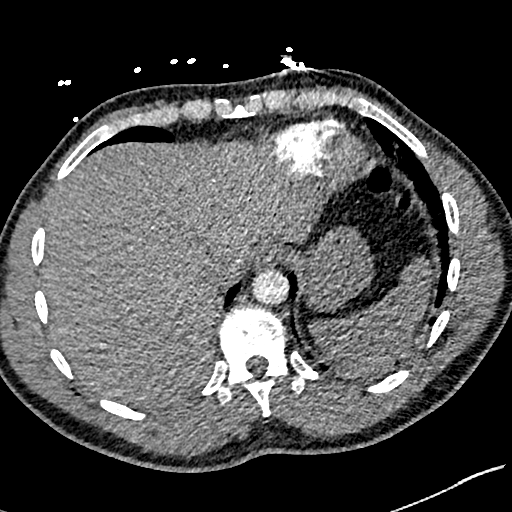
[im 78/281  lung]
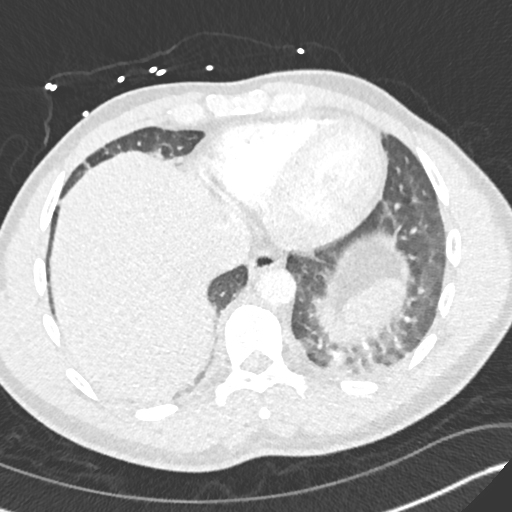
[im 94/281  mediastinal]
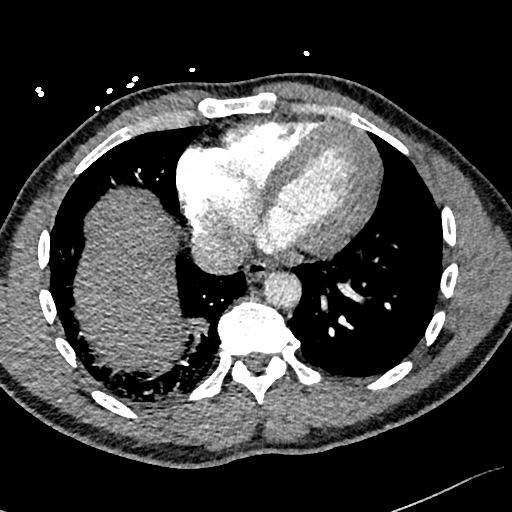
[im 109/281  lung]
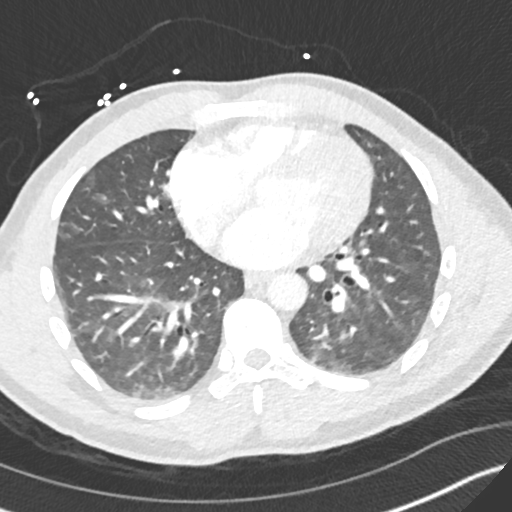
[im 125/281  mediastinal]
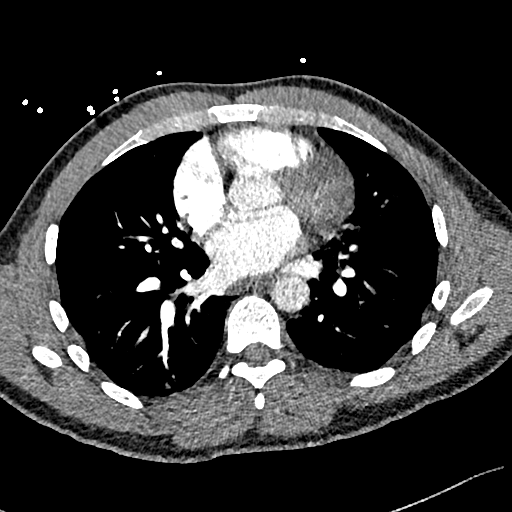
[im 141/281  lung]
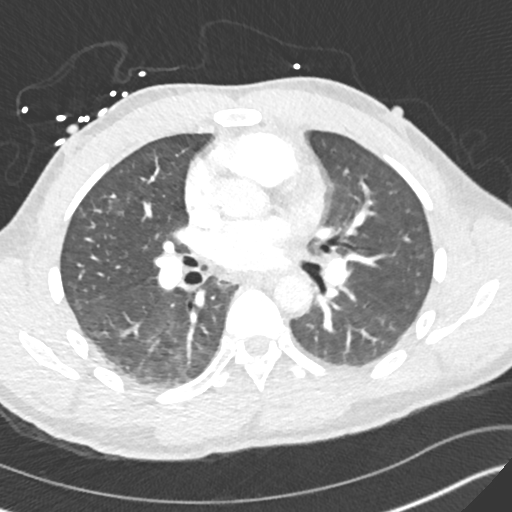
[im 156/281  mediastinal]
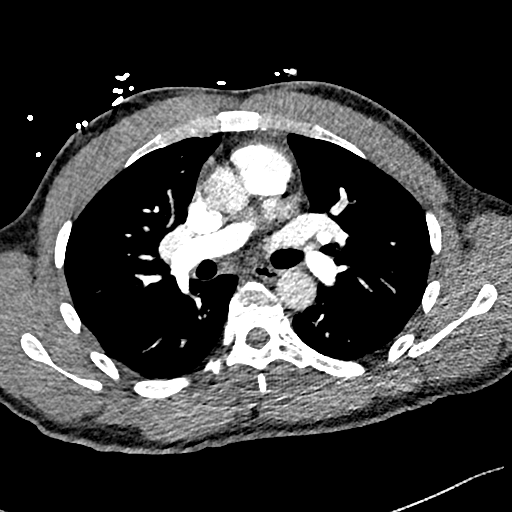
[im 172/281  lung]
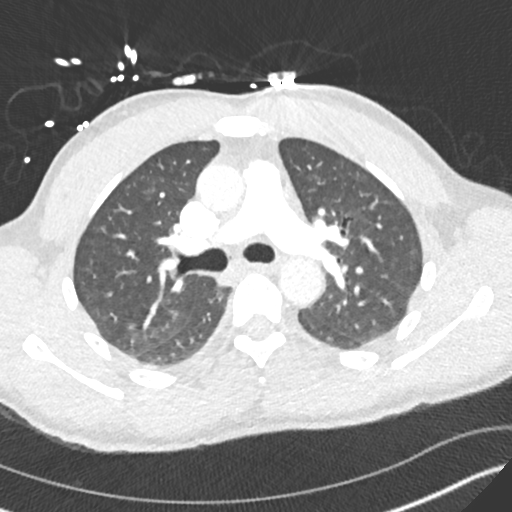
[im 187/281  mediastinal]
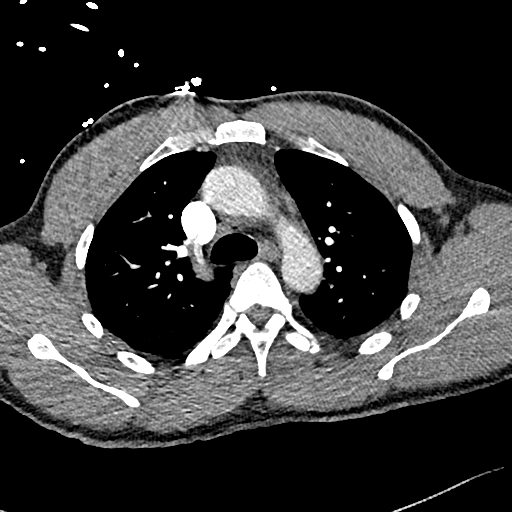
[im 203/281  lung]
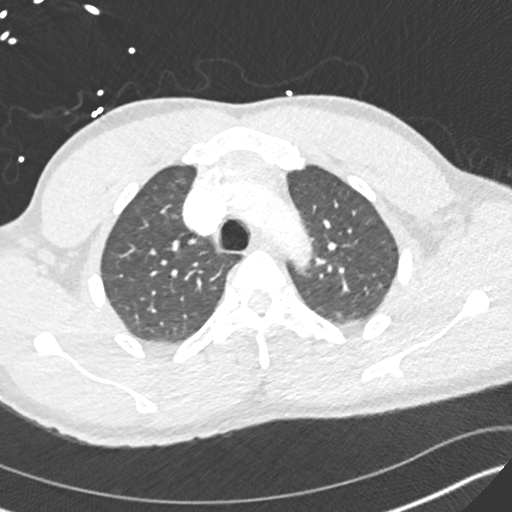
[im 218/281  mediastinal]
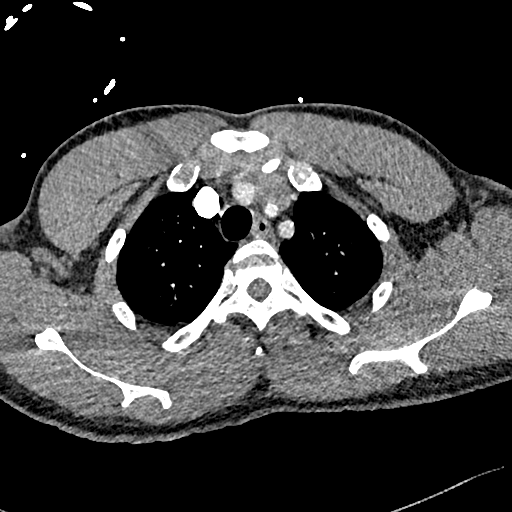
[im 234/281  lung]
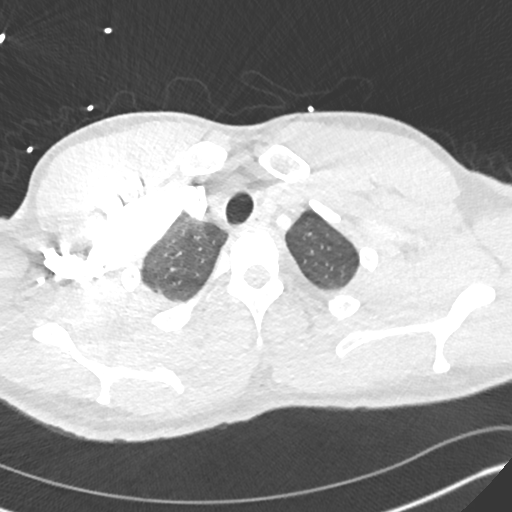
[im 249/281  mediastinal]
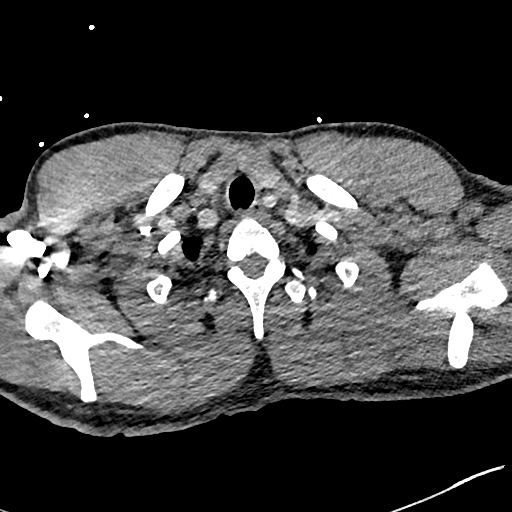
[im 265/281  lung]
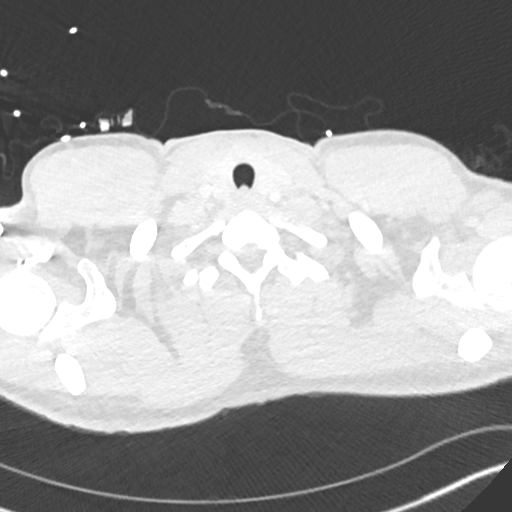

[Series 8: pe 2mm cor · coronal · 0.57mm/px · 1 of 102 slices shown]
[im 51/102  mediastinal]
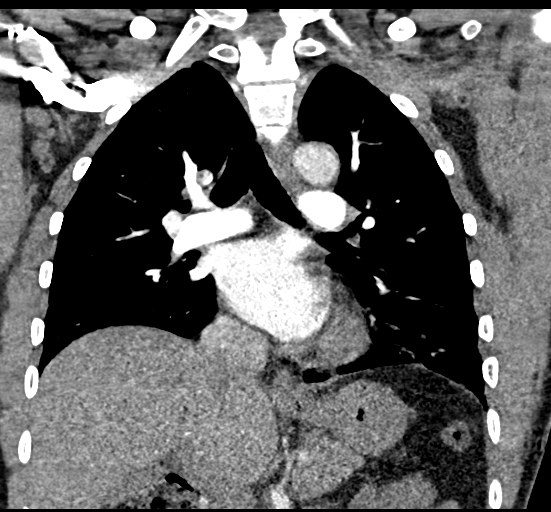

[18 of 36 positions shown; findings below may reference images not displayed]

FINDINGS: Cardiovascular: No pulmonary embolus is identified. Heart size is
upper normal. No pericardial effusion. No calcific atherosclerosis
is identified.

Mediastinum/Nodes: No enlarged mediastinal, hilar, or axillary lymph
nodes. Thyroid gland, trachea, and esophagus demonstrate no
significant findings.

Lungs/Pleura: No pleural effusion. There is some dependent
atelectasis in the lung bases bilaterally. The lungs are otherwise
clear.

Upper Abdomen: Negative.

Musculoskeletal: No focal bony abnormality.

Review of the MIP images confirms the above findings.
IMPRESSION: Negative for pulmonary embolus or acute disease.

## 2018-08-02 IMAGING — DX DG CHEST 2V
2 series · 2 of 2 positions shown · non-contrast
Comparison: 06/16/2017

CLINICAL DATA: Patient was seen in the ED [REDACTED] for intermittent
chest pain. Chest pain is now constant. Sharp central non radiating
pain. Intermittent shortness of breath.

EXAM:
CHEST  2 VIEW

[chest pa]
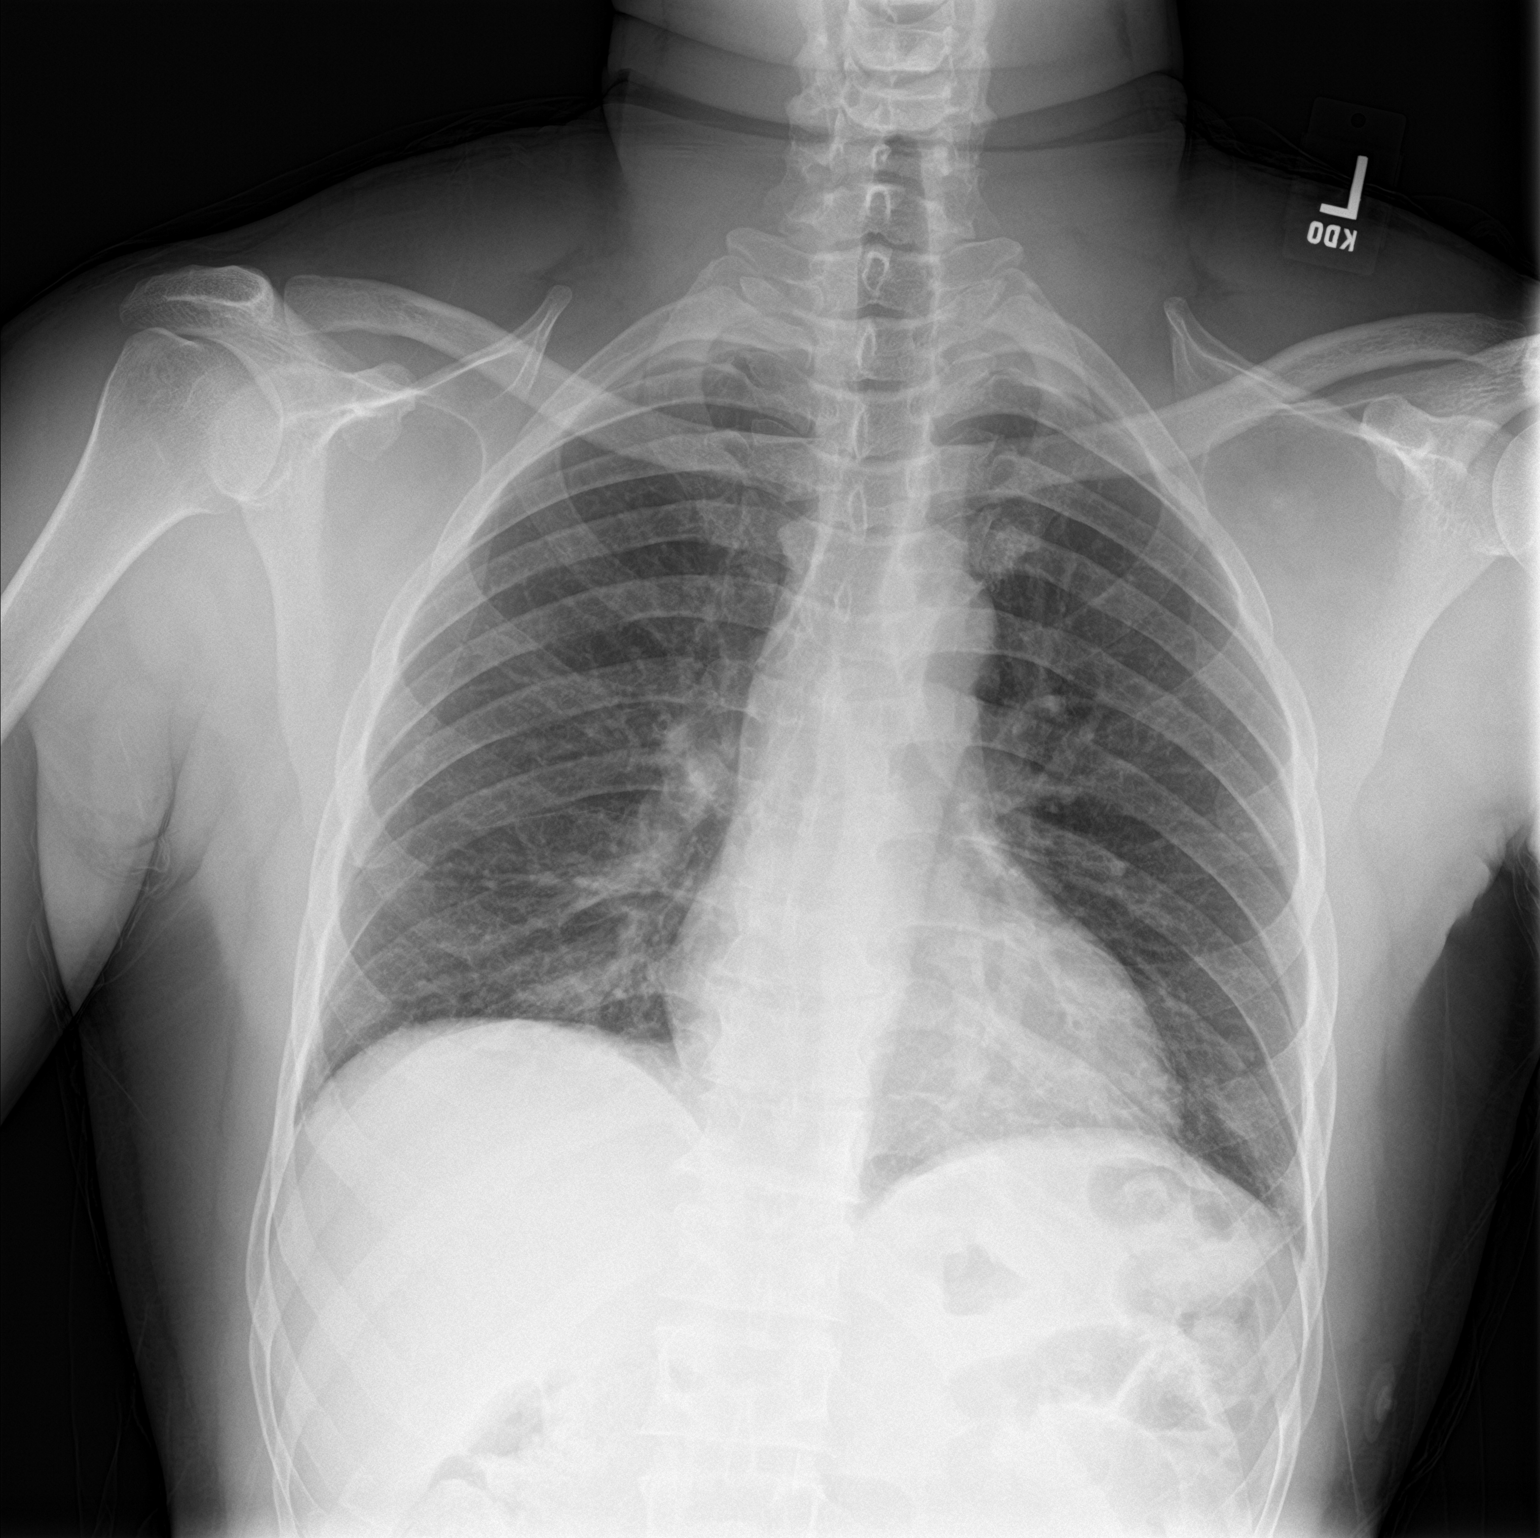

[chest lat]
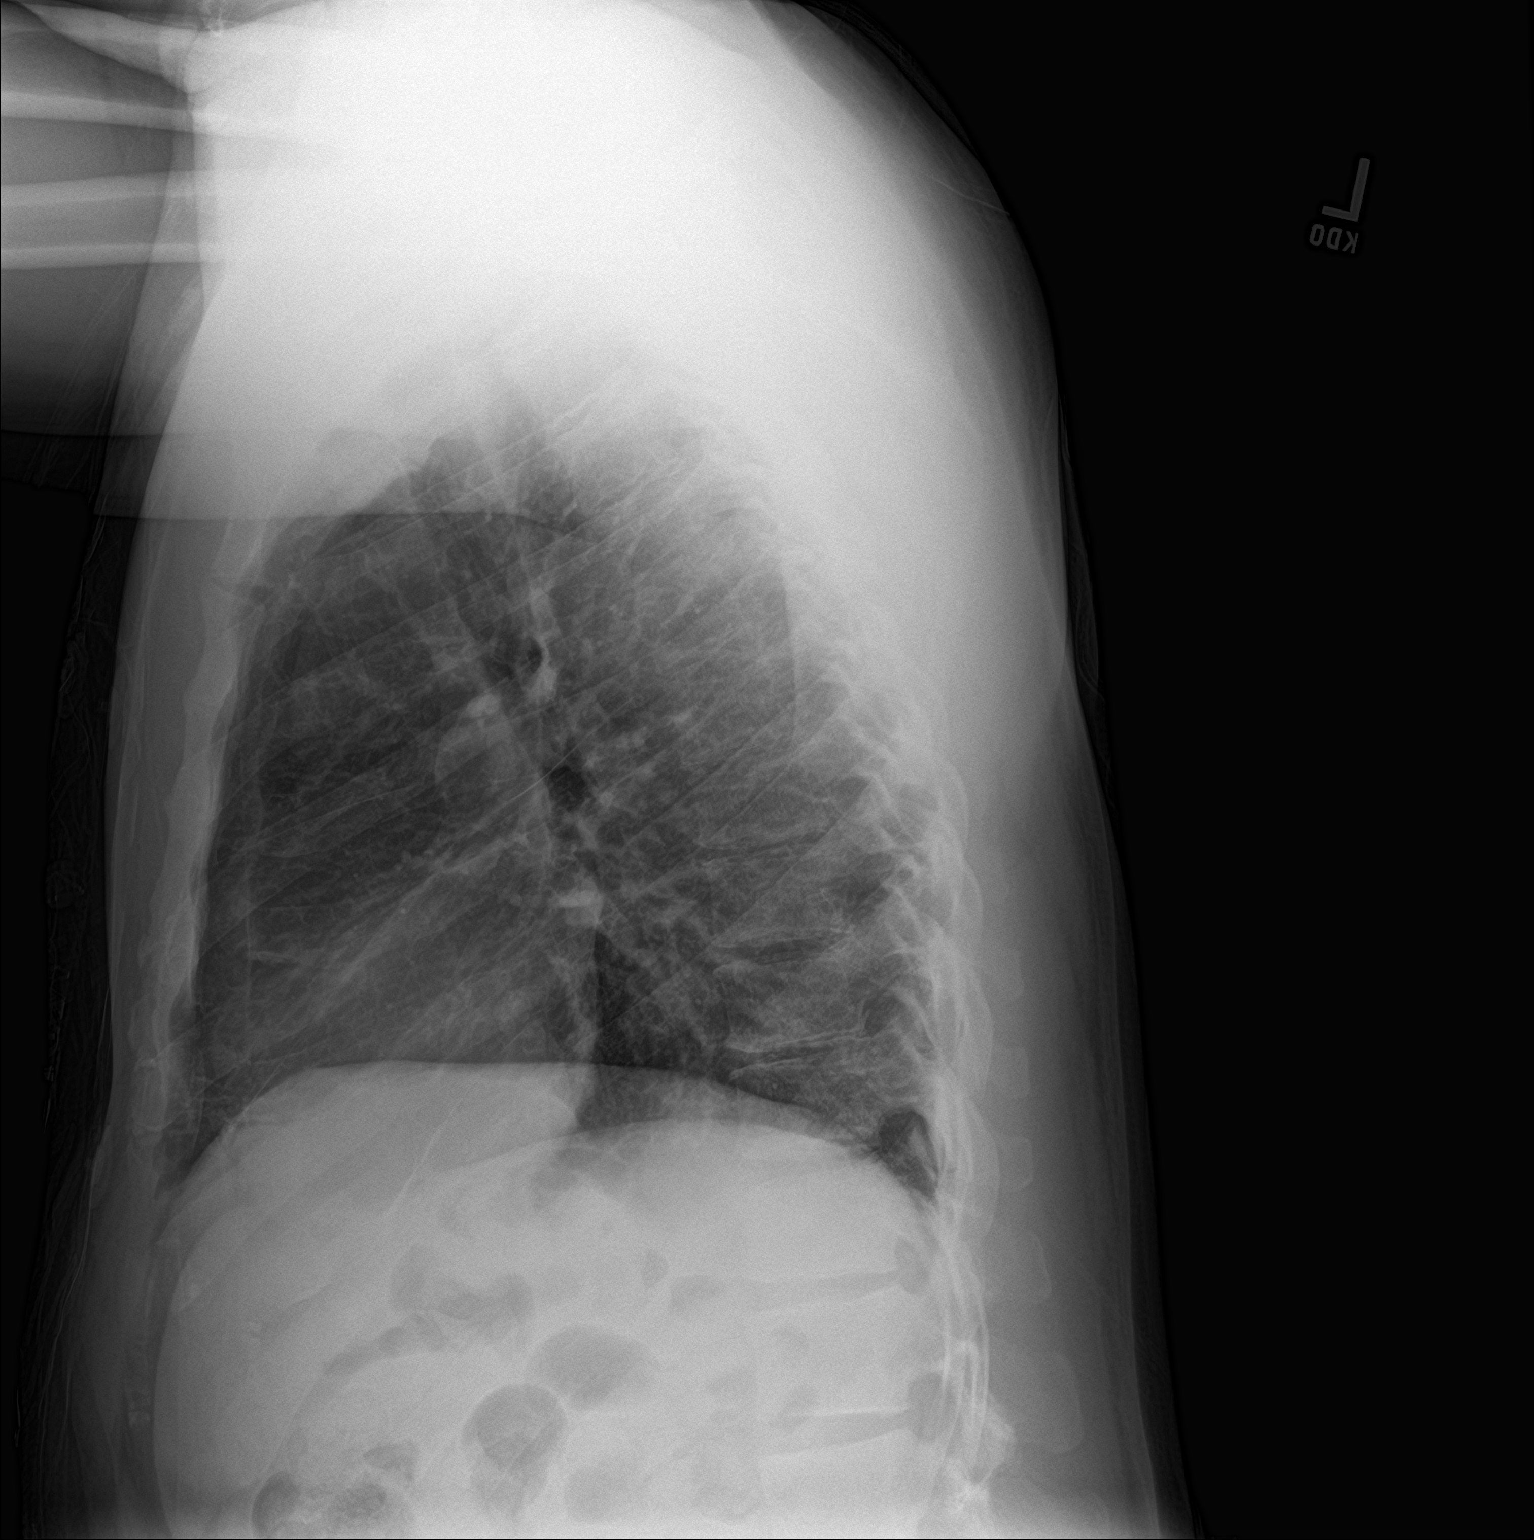

[2 of 2 positions shown; findings below may reference images not displayed]

FINDINGS: Slightly shallow inspiration. The heart size and mediastinal
contours are within normal limits. Both lungs are clear. The
visualized skeletal structures are unremarkable.
IMPRESSION: No active cardiopulmonary disease.

## 2018-08-05 IMAGING — CR DG CHEST 2V
2 series · 2 of 2 positions shown · non-contrast
Comparison: Chest CT 06/18/2017

CLINICAL DATA: Chest pain fever

EXAM:
CHEST  2 VIEW

[w chest pa]
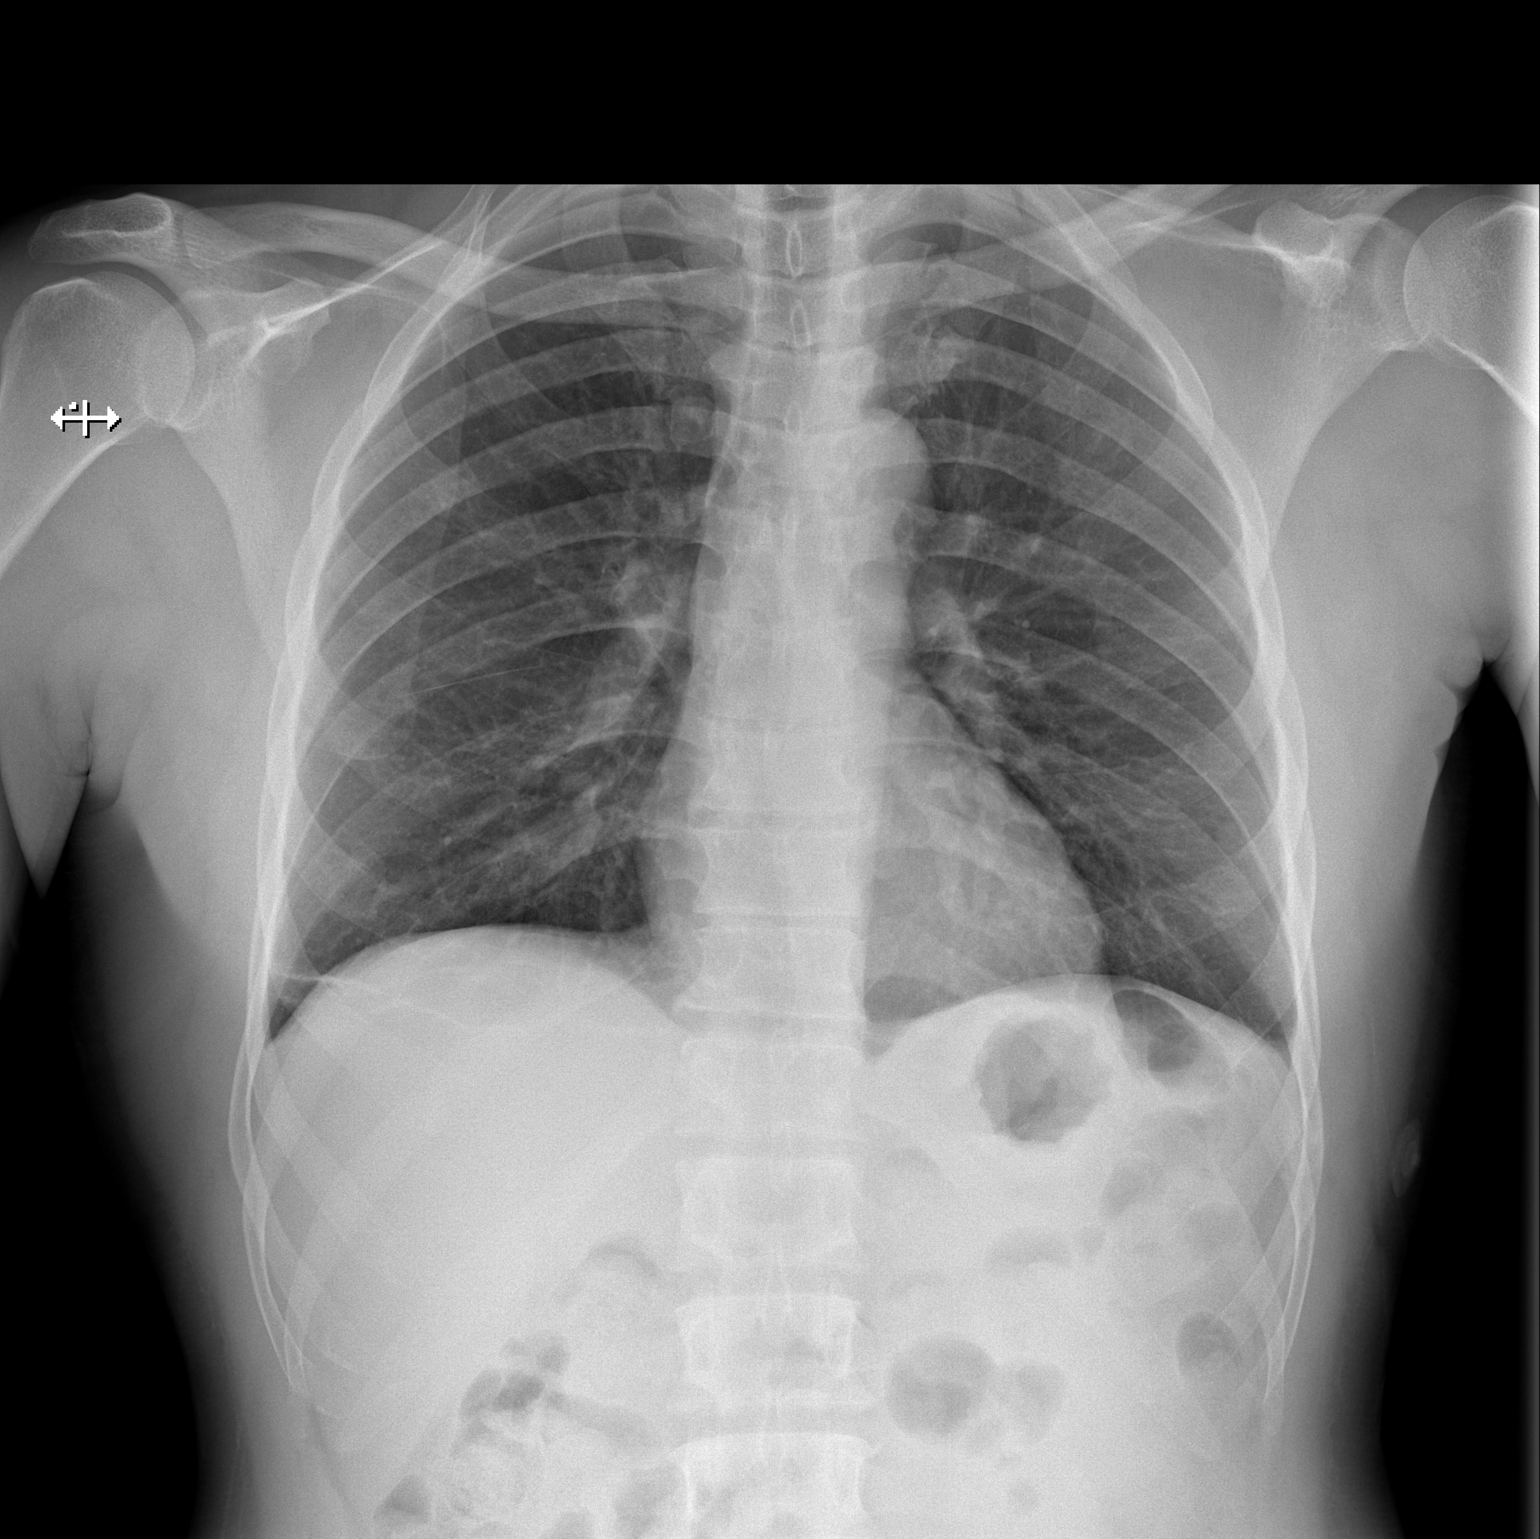

[w chest lat]
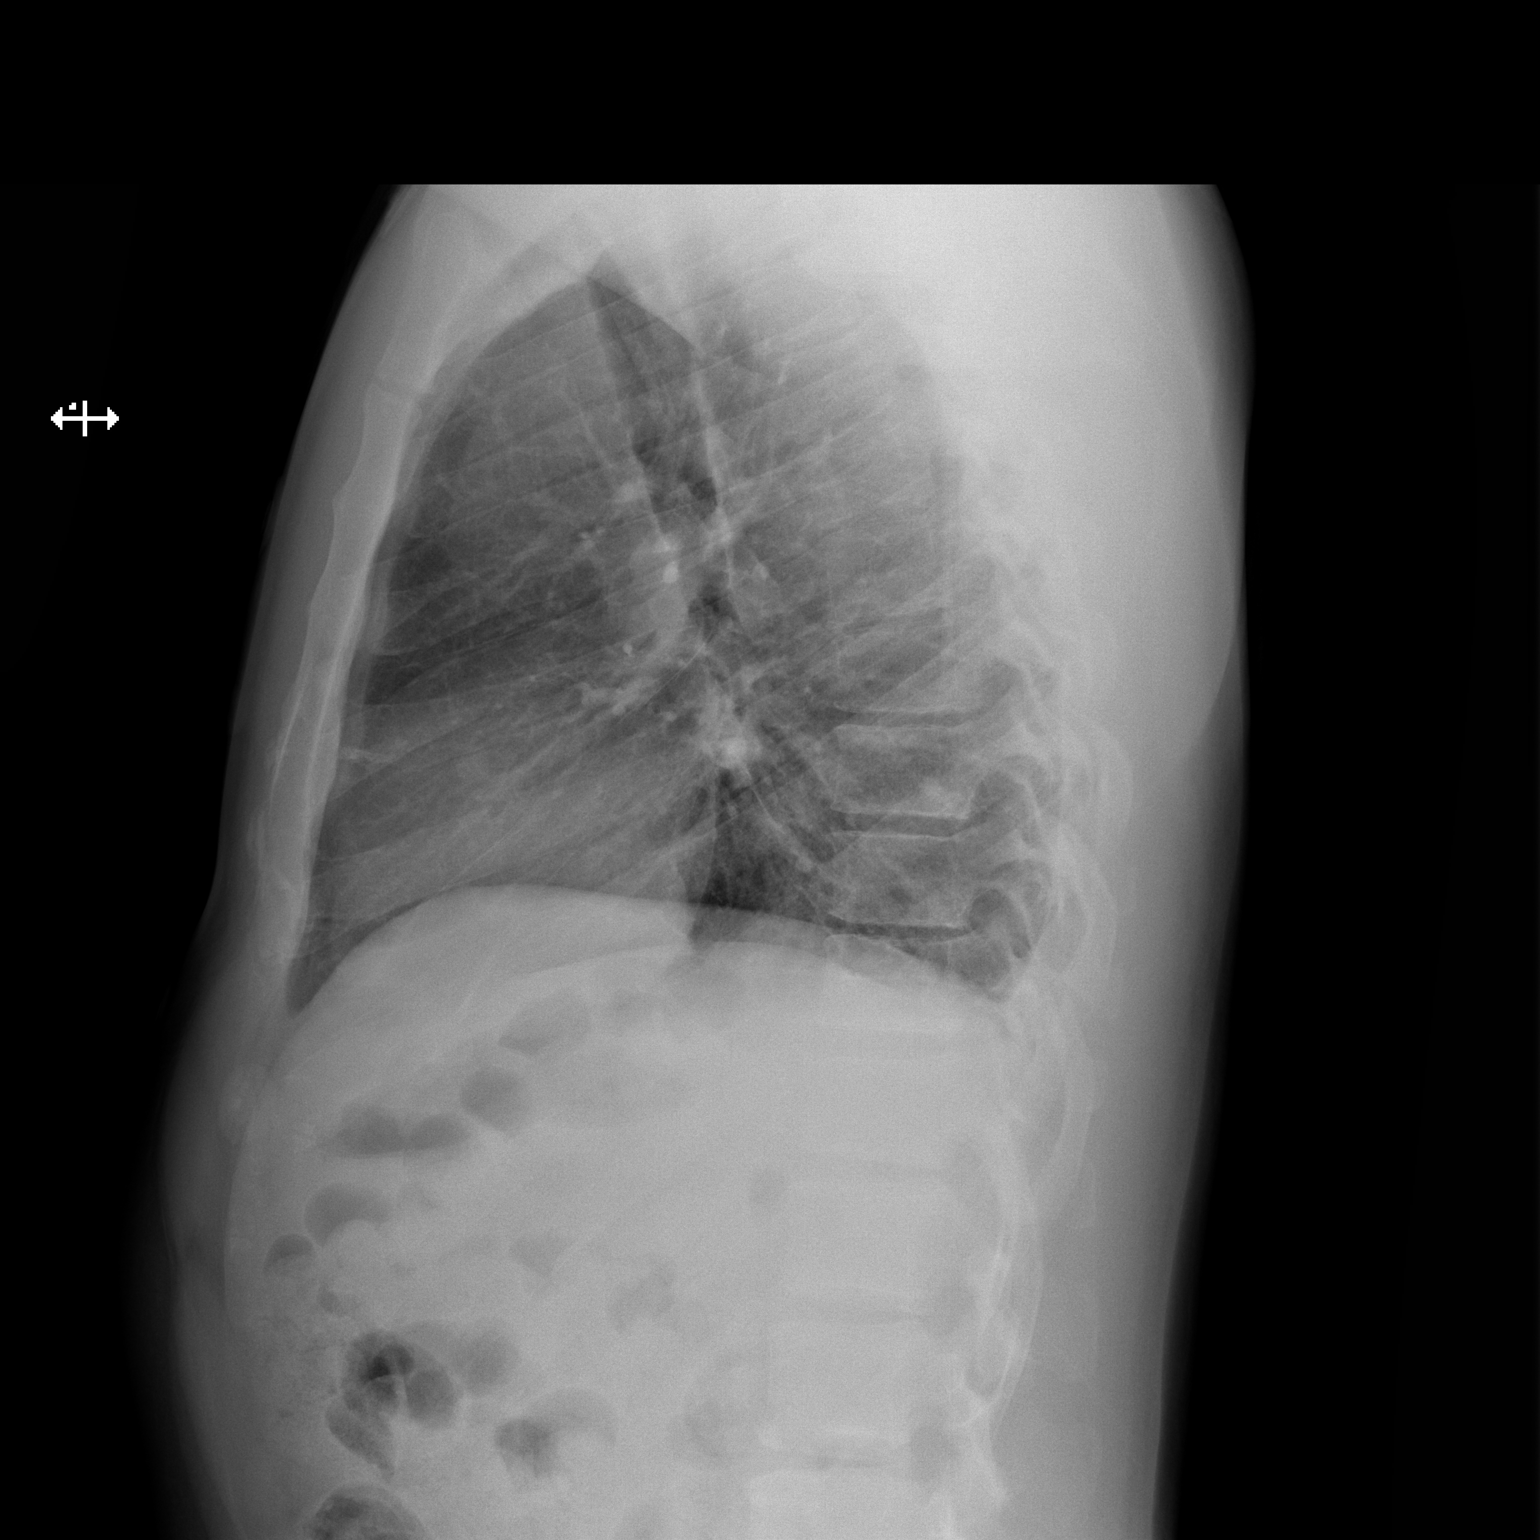

[2 of 2 positions shown; findings below may reference images not displayed]

FINDINGS: Normal cardiac silhouette. Mild streaky densities in the RIGHT lung
base corresponding mild airspace disease on comparison CT. Trace
pleural effusions posteriorly.
IMPRESSION: Concern for mild RIGHT lower lobe pneumonia versus atelectasis..

## 2020-01-20 ENCOUNTER — Encounter (HOSPITAL_COMMUNITY): Payer: Self-pay | Admitting: *Deleted

## 2020-01-20 ENCOUNTER — Emergency Department (HOSPITAL_COMMUNITY)
Admission: EM | Admit: 2020-01-20 | Discharge: 2020-01-20 | Disposition: A | Discharge status: Home / Self Care | Payer: BLUE CROSS/BLUE SHIELD | Attending: Emergency Medicine | Admitting: Emergency Medicine

## 2020-01-20 ENCOUNTER — Other Ambulatory Visit: Payer: Self-pay

## 2020-01-20 DIAGNOSIS — L539 Erythematous condition, unspecified: Secondary | ICD-10-CM | POA: Insufficient documentation

## 2020-01-20 DIAGNOSIS — Z79899 Other long term (current) drug therapy: Secondary | ICD-10-CM | POA: Insufficient documentation

## 2020-01-20 DIAGNOSIS — J029 Acute pharyngitis, unspecified: Secondary | ICD-10-CM

## 2020-01-20 DIAGNOSIS — Z20822 Contact with and (suspected) exposure to covid-19: Secondary | ICD-10-CM | POA: Insufficient documentation

## 2020-01-20 LAB — GROUP A STREP BY PCR: Group A Strep by PCR: NOT DETECTED

## 2020-01-20 LAB — SARS CORONAVIRUS 2 BY RT PCR (HOSPITAL ORDER, PERFORMED IN ~~LOC~~ HOSPITAL LAB): SARS Coronavirus 2: NEGATIVE

## 2020-01-20 MED ORDER — NAPROXEN 500 MG PO TABS: 500.0000 mg | ORAL_TABLET | Freq: Two times a day (BID) | ORAL | 0 refills | Status: AC | PRN

## 2020-01-20 MED ORDER — NAPROXEN 250 MG PO TABS
500.0000 mg | ORAL_TABLET | Freq: Once | ORAL | Status: AC
  Administered 2020-01-20: 500 mg via ORAL
  Filled 2020-01-20: qty 2

## 2020-01-20 MED ORDER — LIDOCAINE VISCOUS HCL 2 % MT SOLN
15.0000 mL | Freq: Once | OROMUCOSAL | Status: AC
  Administered 2020-01-20: 11:00:00 15 mL via OROMUCOSAL
  Filled 2020-01-20: qty 15

## 2020-01-20 NOTE — Discharge Instructions (Addendum)
You were seen in the emergency department today for a sore throat.  Your Covid test and strep test were negative.  We suspect your symptoms may be viral or allergic.  We are sending home with naproxen to help with pain.  - Naproxen is a nonsteroidal anti-inflammatory medication that will help with pain and swelling. Be sure to take this medication as prescribed with food, 1 pill every 12 hours,  It should be taken with food, as it can cause stomach upset, and more seriously, stomach bleeding. Do not take other nonsteroidal anti-inflammatory medications with this such as Advil, Motrin, Aleve, Mobic, Goodie Powder, or Motrin.    You make take Tylenol per over the counter dosing with these medications.   We have prescribed you new medication(s) today. Discuss the medications prescribed today with your pharmacist as they can have adverse effects and interactions with your other medicines including over the counter and prescribed medications. Seek medical evaluation if you start to experience new or abnormal symptoms after taking one of these medicines, seek care immediately if you start to experience difficulty breathing, feeling of your throat closing, facial swelling, or rash as these could be indications of a more serious allergic reaction   Please follow with your primary care provider within 3 days for reevaluation.  Return to the ER for new or worsening symptoms including but not limited to fever, increased pain, trouble swallowing, change in your voice, trouble breathing, neck pain/swelling, trouble turning her head or moving your neck or opening your mouth, or any other concerns.   Please have your blood pressure rechecked at your follow-up appointment as it was elevated in the ER today.

## 2020-01-20 NOTE — ED Provider Notes (Signed)
[facility] EMERGENCY DEPARTMENT Provider Note   CSN: [id number] Arrival date & time: 01/20/20  0253     History Chief Complaint  Patient presents with   Sore Throat    Edward Huerta is a 39 y.o. male without significant past medical history who presents to the emergency department with complaints of sore throat for the past 3 days.  Patient states pain is constant, worse with swallowing, no alleviating factors.  No intervention prior to arrival.  He has had some mild associated headaches, gradual onset, steady progression, similar to prior.  He denies fever, chills, nasal congestion, ear pain, cough, or dyspnea.  HPI     History reviewed. No pertinent past medical history.  There are no problems to display for this patient.   History reviewed. No pertinent surgical history.     No family history on file.  Social History   Tobacco Use   Smoking status: Never Smoker   Smokeless tobacco: Never Used  Vaping Use   Vaping Use: Never used  Substance Use Topics   Alcohol use: Yes    Comment: occassionally   Drug use: No    Home Medications Prior to Admission medications   Medication Sig Start Date End Date Taking? Authorizing Provider  acetaminophen (TYLENOL) 500 MG tablet Take 500 mg by mouth 3 (three) times daily as needed (PAIN).    [provider]  albuterol (PROVENTIL HFA;VENTOLIN HFA) 108 (90 Base) MCG/ACT inhaler Inhale 1-2 puffs into the lungs every 6 (six) hours as needed for wheezing or shortness of breath. Patient not taking: Reported on 06/21/2017 06/18/17   Edward Loll T, PA-C  cyclobenzaprine (FLEXERIL) 5 MG tablet Take 1 tablet (5 mg total) by mouth 3 (three) times daily as needed for muscle spasms. 06/16/17   Edward Pouch, MD  ibuprofen (ADVIL,MOTRIN) 800 MG tablet Take 1 tablet (800 mg total) by mouth 3 (three) times daily. Patient not taking: Reported on 06/18/2017 11/10/12   Elson Areas, PA-C  meloxicam (MOBIC) 15 MG  tablet Take 1 tablet (15 mg total) by mouth daily. 06/18/17   Edward Mu, PA-C  methocarbamol (ROBAXIN) 500 MG tablet Take 1 tablet (500 mg total) by mouth 2 (two) times daily. Patient not taking: Reported on 06/18/2017 11/10/12   Elson Areas, PA-C  omeprazole (PRILOSEC) 20 MG capsule Take 1 capsule (20 mg total) by mouth daily. 06/21/17   Edward Barrette, MD    Allergies    Patient has no known allergies.  Review of Systems   Review of Systems  Constitutional: Negative for chills and fever.  HENT: Positive for sore throat. Negative for congestion, ear pain and voice change.   Respiratory: Negative for cough and shortness of breath.   Cardiovascular: Negative for chest pain.  Gastrointestinal: Negative for vomiting.    Physical Exam Updated Vital Signs BP (!) 162/96 (BP Location: Left Arm)    Pulse 61    Temp 97.9 F (36.6 C) (Oral)    Resp 17    SpO2 100%   Physical Exam Vitals and nursing note reviewed.  Constitutional:      General: He is not in acute distress.    Appearance: He is well-developed. He is not toxic-appearing.  HENT:     Head: Normocephalic and atraumatic.     Right Ear: Ear canal normal. Tympanic membrane is not perforated, erythematous, retracted or bulging.     Left Ear: Ear canal normal. Tympanic membrane is not perforated, erythematous, retracted or bulging.  Ears:     Comments: No mastoid erythema/swellng/tenderness.     Nose:     Right Sinus: No maxillary sinus tenderness or frontal sinus tenderness.     Left Sinus: No maxillary sinus tenderness or frontal sinus tenderness.     Mouth/Throat:     Pharynx: Oropharynx is clear. Uvula midline. Posterior oropharyngeal erythema present. No oropharyngeal exudate.     Comments: Posterior oropharynx is symmetric appearing. Patient tolerating own secretions without difficulty. No trismus. No drooling. No hot potato voice. No swelling beneath the tongue, submandibular compartment is soft.  Eyes:      General:        Right eye: No discharge.        Left eye: No discharge.     Conjunctiva/sclera: Conjunctivae normal.  Cardiovascular:     Rate and Rhythm: Normal rate and regular rhythm.  Pulmonary:     Effort: Pulmonary effort is normal. No respiratory distress.     Breath sounds: Normal breath sounds. No wheezing, rhonchi or rales.  Abdominal:     General: There is no distension.     Palpations: Abdomen is soft.     Tenderness: There is no abdominal tenderness.  Musculoskeletal:     Cervical back: Neck supple. No rigidity.  Lymphadenopathy:     Cervical: No cervical adenopathy.  Skin:    General: Skin is warm and dry.     Findings: No rash.  Neurological:     General: No focal deficit present.     Mental Status: He is alert.     Comments: Ambulatory CN III-XII grossly intact. Strength & sensation grossly intact x 4.   Psychiatric:        Behavior: Behavior normal.     ED Results / Procedures / Treatments   Labs (all labs ordered are listed, but only abnormal results are displayed) Labs Reviewed  SARS CORONAVIRUS 2 BY RT PCR (HOSPITAL ORDER, PERFORMED IN [facility])  GROUP A STREP BY PCR    EKG None  Radiology No results found.  Procedures Procedures (including critical care time)  Medications Ordered in ED Medications  lidocaine (XYLOCAINE) 2 % viscous mouth solution 15 mL (15 mLs Mouth/Throat Given 01/20/20 1034)  naproxen (NAPROSYN) tablet 500 mg (500 mg Oral Given 01/20/20 1034)    ED Course  I have reviewed the triage vital signs and the nursing notes.  Pertinent labs & imaging results that were available during my care of the patient were reviewed by me and considered in my medical decision making (see chart for details).  [name] was evaluated in Emergency Department on 01/20/2020 for the symptoms described in the history of present illness. He/she was evaluated in the context of the global COVID-19 pandemic, which necessitated  consideration that the patient might be at risk for infection with the SARS-CoV-2 virus that causes COVID-19. Institutional protocols and algorithms that pertain to the evaluation of patients at risk for COVID-19 are in a state of rapid change based on information released by regulatory bodies including the CDC and federal and state organizations. These policies and algorithms were followed during the patient's care in the ED.    MDM Rules/Calculators/A&P                         Patient presents to the emergency department with complaints of sore throat, also said some mild headaches.  He is nontoxic, resting comfortably, his blood pressure is elevated, does have a  history of similar, doubt hypertensive emergency.  HEENT exam is overall benign with the exception of some mild posterior pharyngeal erythema.  Exam is not consistent with PTA/RPA.  Patient has no meningismus.  He has no focal neurologic deficits.  His Covid and strep test are H-.  Likely viral versus allergic.  Will treat supportively, prescription for naproxen, last creatinine within normal limits on chart review for additional history.Marland Kitchen  PCP follow-up. I discussed results, treatment plan, need for follow-up, and return precautions with the patient. Provided opportunity for questions, patient confirmed understanding and is in agreement with plan.   Final Clinical Impression(s) / ED Diagnoses Final diagnoses:  Sore throat    Rx / DC Orders ED Discharge Orders         Ordered    naproxen (NAPROSYN) 500 MG tablet  2 times daily PRN        01/20/20 1113           Helane Briceno, [place], PA-C 01/20/20 1134    Tilden Fossa, MD 01/20/20 1237

## 2020-01-20 NOTE — ED Triage Notes (Signed)
Pt with sore throat x 3 days and headache x4 days.
